# Patient Record
Sex: Female | Born: 1990 | Race: Black or African American | Hispanic: No | Marital: Single | Smoking: Never smoker
Health system: Southern US, Community
[De-identification: ages and names within clinical notes are randomized; demographics above are authoritative.]

---

## 2015-11-06 ENCOUNTER — Other Ambulatory Visit (HOSPITAL_COMMUNITY)
Admission: RE | Admit: 2015-11-06 | Discharge: 2015-11-06 | Disposition: A | Discharge status: Home / Self Care | Payer: Managed Care, Other (non HMO) | Source: Ambulatory Visit | Attending: Nurse Practitioner | Admitting: Nurse Practitioner

## 2015-11-06 DIAGNOSIS — Z113 Encounter for screening for infections with a predominantly sexual mode of transmission: Secondary | ICD-10-CM | POA: Diagnosis present

## 2015-11-06 DIAGNOSIS — Z01419 Encounter for gynecological examination (general) (routine) without abnormal findings: Secondary | ICD-10-CM | POA: Diagnosis not present

## 2016-03-10 ENCOUNTER — Encounter (HOSPITAL_COMMUNITY): Payer: Self-pay | Admitting: Emergency Medicine

## 2016-03-10 DIAGNOSIS — Y92830 Public park as the place of occurrence of the external cause: Secondary | ICD-10-CM | POA: Insufficient documentation

## 2016-03-10 DIAGNOSIS — Y9344 Activity, trampolining: Secondary | ICD-10-CM | POA: Insufficient documentation

## 2016-03-10 DIAGNOSIS — S93401A Sprain of unspecified ligament of right ankle, initial encounter: Secondary | ICD-10-CM | POA: Insufficient documentation

## 2016-03-10 DIAGNOSIS — Y999 Unspecified external cause status: Secondary | ICD-10-CM | POA: Diagnosis not present

## 2016-03-10 DIAGNOSIS — X509XXA Other and unspecified overexertion or strenuous movements or postures, initial encounter: Secondary | ICD-10-CM | POA: Insufficient documentation

## 2016-03-10 DIAGNOSIS — S99911A Unspecified injury of right ankle, initial encounter: Secondary | ICD-10-CM | POA: Diagnosis present

## 2016-03-10 NOTE — ED Notes (Signed)
Pt. reports right ankle pain with mild swelling onset last month injured while playing on a trampoline . Ambulatory / pain increases with palpation and weight bearing .

## 2016-03-11 ENCOUNTER — Emergency Department (HOSPITAL_COMMUNITY): Payer: Managed Care, Other (non HMO)

## 2016-03-11 ENCOUNTER — Emergency Department (HOSPITAL_COMMUNITY)
Admission: EM | Admit: 2016-03-11 | Discharge: 2016-03-11 | Disposition: A | Discharge status: Home / Self Care | Payer: Managed Care, Other (non HMO) | Attending: Emergency Medicine | Admitting: Emergency Medicine

## 2016-03-11 DIAGNOSIS — S93401A Sprain of unspecified ligament of right ankle, initial encounter: Secondary | ICD-10-CM

## 2016-03-11 NOTE — ED Provider Notes (Signed)
CSN: [id number]     Arrival date & time 03/10/16  2335 History  By signing my name below, I, Katherine Higgins, attest that this documentation has been prepared under the direction and in the presence of Katherine MondayErin Kenwood Rosiak, MD. Electronically Signed: Bethel BornBritney Higgins, ED Scribe. 03/11/2016. 3:13 AM   Chief Complaint  Patient presents with  . Ankle Pain   The history is provided by the patient. No language interpreter was used.   Katherine Higgins is a 25 y.o. female who presents to the Emergency Department complaining of ongoing, 8/10 in severity, lateral right ankle pain and swelling with onset 1 week ago after [place]ampoline park. The pain is exacerbated by walking. Pt denies other injury.   History reviewed. No pertinent past medical history. History reviewed. No pertinent past surgical history. No family history on file. Social History  Substance Use Topics  . Smoking status: Never Smoker   . Smokeless tobacco: None  . Alcohol Use: No   OB History    No data available     Review of Systems  Constitutional: Negative for fever.  HENT: Negative for sore throat.   Eyes: Negative for visual disturbance.  Respiratory: Negative for cough and shortness of breath.   Cardiovascular: Negative for chest pain.  Gastrointestinal: Negative for abdominal pain.  Genitourinary: Negative for difficulty urinating.  Musculoskeletal: Positive for arthralgias (right ankle pain and swelling). Negative for back pain and neck pain.  Skin: Negative for rash and wound.  Neurological: Negative for syncope and headaches.      Allergies  Review of patient's allergies indicates no known allergies.  Home Medications   Prior to Admission medications   Not on File   BP 112/57 mmHg  Pulse 74  Temp(Src) 98.5 F (36.9 C) (Oral)  Resp 16  Ht 5\' 6"  (1.676 m)  Wt 144 lb 5 oz (65.46 kg)  BMI 23.30 kg/m2  SpO2 100%  LMP 02/24/2016 (Approximate) Physical Exam  Constitutional: She is  oriented to person, place, and time. She appears well-developed and well-nourished. No distress.  HENT:  Head: Normocephalic and atraumatic.  Eyes: Conjunctivae and EOM are normal.  Neck: Normal range of motion.  Cardiovascular: Normal rate, regular rhythm and intact distal pulses.   Pulmonary/Chest: Effort normal. No respiratory distress.  Musculoskeletal: She exhibits no edema.       Right ankle: She exhibits swelling (mild). She exhibits normal range of motion and no deformity. Tenderness. AITFL tenderness found. No posterior TFL, no head of 5th metatarsal and no proximal fibula tenderness found.  Tenderness at the right lateral malleolus   Neurological: She is alert and oriented to person, place, and time.  Skin: Skin is warm and dry. No rash noted. She is not diaphoretic. No erythema.  Nursing note and vitals reviewed.   ED Course  Procedures (including critical care time) DIAGNOSTIC STUDIES: Oxygen Saturation is 100% on RA,  normal by my interpretation.    COORDINATION OF CARE: 3:12 AM Discussed treatment plan which includes right ankle XR, splinting, and crutches with pt at bedside and pt agreed to plan.  Labs Review Labs Reviewed - No data to display  Imaging Review Dg Ankle Complete Right  03/11/2016  CLINICAL DATA:  25 year old female with right ankle injury. EXAM: RIGHT ANKLE - COMPLETE 3+ VIEW COMPARISON:  None. FINDINGS: There is no evidence of fracture, dislocation, or joint effusion. There is no evidence of arthropathy or other focal bone abnormality. Soft tissues are unremarkable. IMPRESSION: Negative. Electronically Signed  By: Katherine CollardArash  Higgins M.D.   On: 03/11/2016 00:37   I have personally reviewed and evaluated these images as part of my medical decision-making.   EKG Interpretation None      MDM   Final diagnoses:  Sprained ankle, right, initial encounter   24yo female with right ankle pain after landing wrong at trampoline park. XR without fx.  NV  intact. Likely ankle sprain. Recommend ice, NSAIDs, aircast, weight bearing as tolerated. Patient discharged in stable condition with understanding of reasons to return.   I personally performed the services described in this documentation, which was scribed in my presence. The recorded information has been reviewed and is accurate.    Katherine MondayErin Katherine Savidge, MD 03/12/16 [phone]

## 2016-03-11 NOTE — Discharge Instructions (Signed)
Ankle Sprain  An ankle sprain is an injury to the strong, fibrous tissues (ligaments) that hold the bones of your ankle joint together.   CAUSES  An ankle sprain is usually caused by a fall or by twisting your ankle. Ankle sprains most commonly occur when you step on the outer edge of your foot, and your ankle turns inward. People who participate in sports are more prone to these types of injuries.   SYMPTOMS   · Pain in your ankle. The pain may be present at rest or only when you are trying to stand or walk.  · Swelling.  · Bruising. Bruising may develop immediately or within 1 to 2 days after your injury.  · Difficulty standing or walking, particularly when turning corners or changing directions.  DIAGNOSIS   Your caregiver will ask you details about your injury and perform a physical exam of your ankle to determine if you have an ankle sprain. During the physical exam, your caregiver will press on and apply pressure to specific areas of your foot and ankle. Your caregiver will try to move your ankle in certain ways. An X-ray exam may be done to be sure a bone was not broken or a ligament did not separate from one of the bones in your ankle (avulsion fracture).   TREATMENT   Certain types of braces can help stabilize your ankle. Your caregiver can make a recommendation for this. Your caregiver may recommend the use of medicine for pain. If your sprain is severe, your caregiver may refer you to a surgeon who helps to restore function to parts of your skeletal system (orthopedist) or a physical therapist.  HOME CARE INSTRUCTIONS   · Apply ice to your injury for 1-2 days or as directed by your caregiver. Applying ice helps to reduce inflammation and pain.    Put ice in a plastic bag.    Place a towel between your skin and the bag.    Leave the ice on for 15-20 minutes at a time, every 2 hours while you are awake.  · Only take over-the-counter or prescription medicines for pain, discomfort, or fever as directed by  your caregiver.  · Elevate your injured ankle above the level of your heart as much as possible for 2-3 days.  · If your caregiver recommends crutches, use them as instructed. Gradually put weight on the affected ankle. Continue to use crutches or a cane until you can walk without feeling pain in your ankle.  · If you have a plaster splint, wear the splint as directed by your caregiver. Do not rest it on anything harder than a pillow for the first 24 hours. Do not put weight on it. Do not get it wet. You may take it off to take a shower or bath.  · You may have been given an elastic bandage to wear around your ankle to provide support. If the elastic bandage is too tight (you have numbness or tingling in your foot or your foot becomes cold and blue), adjust the bandage to make it comfortable.  · If you have an air splint, you may blow more air into it or let air out to make it more comfortable. You may take your splint off at night and before taking a shower or bath. Wiggle your toes in the splint several times per day to decrease swelling.  SEEK MEDICAL CARE IF:   · You have rapidly increasing bruising or swelling.  · Your toes feel   extremely cold or you lose feeling in your foot.  · Your pain is not relieved with medicine.  SEEK IMMEDIATE MEDICAL CARE IF:  · Your toes are numb or blue.  · You have severe pain that is increasing.  MAKE SURE YOU:   · Understand these instructions.  · Will watch your condition.  · Will get help right away if you are not doing well or get worse.     This information is not intended to replace advice given to you by your health care provider. Make sure you discuss any questions you have with your health care provider.     Document Released: 09/06/2005 Document Revised: 09/27/2014 Document Reviewed: 09/18/2011  Elsevier Interactive Patient Education ©2016 [employer]      Acute Ankle Sprain With Phase I Rehab  An acute ankle sprain is a partial or complete tear in one or more of the  ligaments of the ankle due to traumatic injury. The severity of the injury depends on both the number of ligaments sprained and the grade of sprain. There are 3 grades of sprains.   · A grade 1 sprain is a mild sprain. There is a slight pull without obvious tearing. There is no loss of strength, and the muscle and ligament are the correct length.  · A grade 2 sprain is a moderate sprain. There is tearing of fibers within the substance of the ligament where it connects two bones or two cartilages. The length of the ligament is increased, and there is usually decreased strength.  · A grade 3 sprain is a complete rupture of the ligament and is uncommon.  In addition to the grade of sprain, there are three types of ankle sprains.   Lateral ankle sprains: This is a sprain of one or more of the three ligaments on the outer side (lateral) of the ankle. These are the most common sprains.  Medial ankle sprains: There is one large triangular ligament of the inner side (medial) of the ankle that is susceptible to injury. Medial ankle sprains are less common.  Syndesmosis, "high ankle," sprains: The syndesmosis is the ligament that connects the two bones of the lower leg. Syndesmosis sprains usually only occur with very severe ankle sprains.  SYMPTOMS  · Pain, tenderness, and swelling in the ankle, starting at the side of injury that may progress to the whole ankle and foot with time.  · "Pop" or tearing sensation at the time of injury.  · Bruising that may spread to the heel.  · Impaired ability to walk soon after injury.  CAUSES   · Acute ankle sprains are caused by trauma placed on the ankle that temporarily forces or pries the anklebone (talus) out of its normal socket.  · Stretching or tearing of the ligaments that normally hold the joint in place (usually due to a twisting injury).  RISK INCREASES WITH:  · Previous ankle sprain.  · Sports in which the foot may land awkwardly (i.e., basketball, volleyball, or soccer) or  walking or running on uneven or rough surfaces.  · Shoes with inadequate support to prevent sideways motion when stress occurs.  · Poor strength and flexibility.  · Poor balance skills.  · Contact sports.  PREVENTION   · Warm up and stretch properly before activity.  · Maintain physical fitness:    Ankle and leg flexibility, muscle strength, and endurance.    Cardiovascular fitness.  · Balance training activities.  · Use proper technique and have a   coach correct improper technique.  · Taping, protective strapping, bracing, or high-top tennis shoes may help prevent injury. Initially, tape is best; however, it loses most of its support function within 10 to 15 minutes.  · Wear proper-fitted protective shoes (High-top shoes with taping or bracing is more effective than either alone).  · Provide the ankle with support during sports and practice activities for 12 months following injury.  PROGNOSIS   · If treated properly, ankle sprains can be expected to recover completely; however, the length of recovery depends on the degree of injury.  · A grade 1 sprain usually heals enough in 5 to 7 days to allow modified activity and requires an average of 6 weeks to heal completely.  · A grade 2 sprain requires 6 to 10 weeks to heal completely.  · A grade 3 sprain requires 12 to 16 weeks to heal.  · A syndesmosis sprain often takes more than 3 months to heal.  RELATED COMPLICATIONS   · Frequent recurrence of symptoms may result in a chronic problem. Appropriately addressing the problem the first time decreases the frequency of recurrence and optimizes healing time. Severity of the initial sprain does not predict the likelihood of later instability.  · Injury to other structures (bone, cartilage, or tendon).  · A chronically unstable or arthritic ankle joint is a possibility with repeated sprains.  TREATMENT  Treatment initially involves the use of ice, medication, and compression bandages to help reduce pain and inflammation.  Ankle sprains are usually immobilized in a walking cast or boot to allow for healing. Crutches may be recommended to reduce pressure on the injury. After immobilization, strengthening and stretching exercises may be necessary to regain strength and a full range of motion. Surgery is rarely needed to treat ankle sprains.  MEDICATION   · Nonsteroidal anti-inflammatory medications, such as aspirin and ibuprofen (do not take for the first 3 days after injury or within 7 days before surgery), or other minor pain relievers, such as acetaminophen, are often recommended. Take these as directed by your caregiver. Contact your caregiver immediately if any bleeding, stomach upset, or signs of an allergic reaction occur from these medications.  · Ointments applied to the skin may be helpful.  · Pain relievers may be prescribed as necessary by your caregiver. Do not take prescription pain medication for longer than 4 to 7 days. Use only as directed and only as much as you need.  HEAT AND COLD  · Cold treatment (icing) is used to relieve pain and reduce inflammation for acute and chronic cases. Cold should be applied for 10 to 15 minutes every 2 to 3 hours for inflammation and pain and immediately after any activity that aggravates your symptoms. Use ice packs or an ice massage.  · Heat treatment may be used before performing stretching and strengthening activities prescribed by your caregiver. Use a heat pack or a warm soak.  SEEK IMMEDIATE MEDICAL CARE IF:   · Pain, swelling, or bruising worsens despite treatment.  · You experience pain, numbness, discoloration, or coldness in the foot or toes.  · New, unexplained symptoms develop (drugs used in treatment may produce side effects.)  EXERCISES   PHASE I EXERCISES  RANGE OF MOTION (ROM) AND STRETCHING EXERCISES - Ankle Sprain, Acute Phase I, Weeks 1 to 2  These exercises may help you when beginning to restore flexibility in your ankle. You will likely work on these exercises for  the 1 to 2 weeks after your injury.   Once your physician, physical therapist, or athletic trainer sees adequate progress, he or she will advance your exercises. While completing these exercises, remember:   · Restoring tissue flexibility helps normal motion to return to the joints. This allows healthier, less painful movement and activity.  · An effective stretch should be held for at least 30 seconds.  · A stretch should never be painful. You should only feel a gentle lengthening or release in the stretched tissue.  RANGE OF MOTION - Dorsi/Plantar Flexion  · While sitting with your right / left knee straight, draw the top of your foot upwards by flexing your ankle. Then reverse the motion, pointing your toes downward.  · Hold each position for __________ seconds.  · After completing your first set of exercises, repeat this exercise with your knee bent.  Repeat __________ times. Complete this exercise __________ times per day.   RANGE OF MOTION - Ankle Alphabet  · Imagine your right / left big toe is a pen.  · Keeping your hip and knee still, write out the entire alphabet with your "pen." Make the letters as large as you can without increasing any discomfort.  Repeat __________ times. Complete this exercise __________ times per day.   STRENGTHENING EXERCISES - Ankle Sprain, Acute -Phase I, Weeks 1 to 2  These exercises may help you when beginning to restore strength in your ankle. You will likely work on these exercises for 1 to 2 weeks after your injury. Once your physician, physical therapist, or athletic trainer sees adequate progress, he or she will advance your exercises. While completing these exercises, remember:   · Muscles can gain both the endurance and the strength needed for everyday activities through controlled exercises.  · Complete these exercises as instructed by your physician, physical therapist, or athletic trainer. Progress the resistance and repetitions only as guided.  · You may experience  muscle soreness or fatigue, but the pain or discomfort you are trying to eliminate should never worsen during these exercises. If this pain does worsen, stop and make certain you are following the directions exactly. If the pain is still present after adjustments, discontinue the exercise until you can discuss the trouble with your clinician.  STRENGTH - Dorsiflexors  · Secure a rubber exercise band/tubing to a fixed object (i.e., table, pole) and loop the other end around your right / left foot.  · Sit on the floor facing the fixed object. The band/tubing should be slightly tense when your foot is relaxed.  · Slowly draw your foot back toward you using your ankle and toes.  · Hold this position for __________ seconds. Slowly release the tension in the band and return your foot to the starting position.  Repeat __________ times. Complete this exercise __________ times per day.   STRENGTH - Plantar-flexors   · Sit with your right / left leg extended. Holding onto both ends of a rubber exercise band/tubing, loop it around the ball of your foot. Keep a slight tension in the band.  · Slowly push your toes away from you, pointing them downward.  · Hold this position for __________ seconds. Return slowly, controlling the tension in the band/tubing.  Repeat __________ times. Complete this exercise __________ times per day.   STRENGTH - Ankle Eversion  · Secure one end of a rubber exercise band/tubing to a fixed object (table, pole). Loop the other end around your foot just before your toes.  · Place your fists between your knees. This will focus   your strengthening at your ankle.  · Drawing the band/tubing across your opposite foot, slowly, pull your little toe out and up. Make sure the band/tubing is positioned to resist the entire motion.  · Hold this position for __________ seconds.  Have your muscles resist the band/tubing as it slowly pulls your foot back to the starting position.   Repeat __________ times. Complete  this exercise __________ times per day.   STRENGTH - Ankle Inversion  · Secure one end of a rubber exercise band/tubing to a fixed object (table, pole). Loop the other end around your foot just before your toes.  · Place your fists between your knees. This will focus your strengthening at your ankle.  · Slowly, pull your big toe up and in, making sure the band/tubing is positioned to resist the entire motion.  · Hold this position for __________ seconds.  · Have your muscles resist the band/tubing as it slowly pulls your foot back to the starting position.  Repeat __________ times. Complete this exercises __________ times per day.   STRENGTH - Towel Curls  · Sit in a chair positioned on a non-carpeted surface.  · Place your right / left foot on a towel, keeping your heel on the floor.  · Pull the towel toward your heel by only curling your toes. Keep your heel on the floor.  · If instructed by your physician, physical therapist, or athletic trainer, add weight to the end of the towel.  Repeat __________ times. Complete this exercise __________ times per day.     This information is not intended to replace advice given to you by your health care provider. Make sure you discuss any questions you have with your health care provider.     Document Released: 04/07/2005 Document Revised: 09/27/2014 Document Reviewed: 12/19/2008  [employer]vier Interactive Patient Education ©2016 [employer]

## 2017-03-06 IMAGING — CR DG ANKLE COMPLETE 3+V*R*
3 series · 3 of 3 positions shown · non-contrast
Comparison: None.

CLINICAL DATA: 24-year-old female with right ankle injury.

EXAM:
RIGHT ANKLE - COMPLETE 3+ VIEW

[ankle ap]
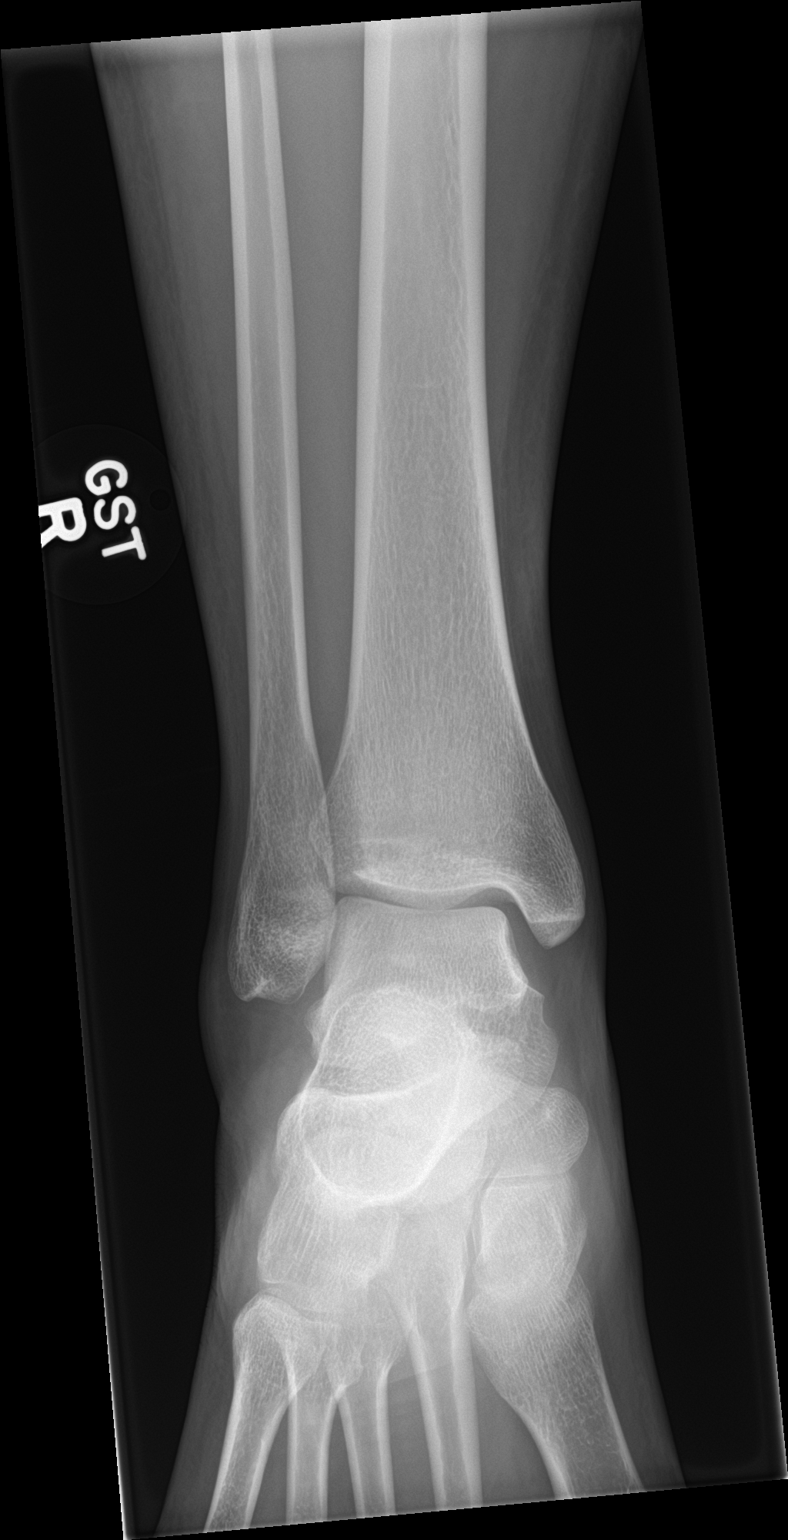

[ankle obl]
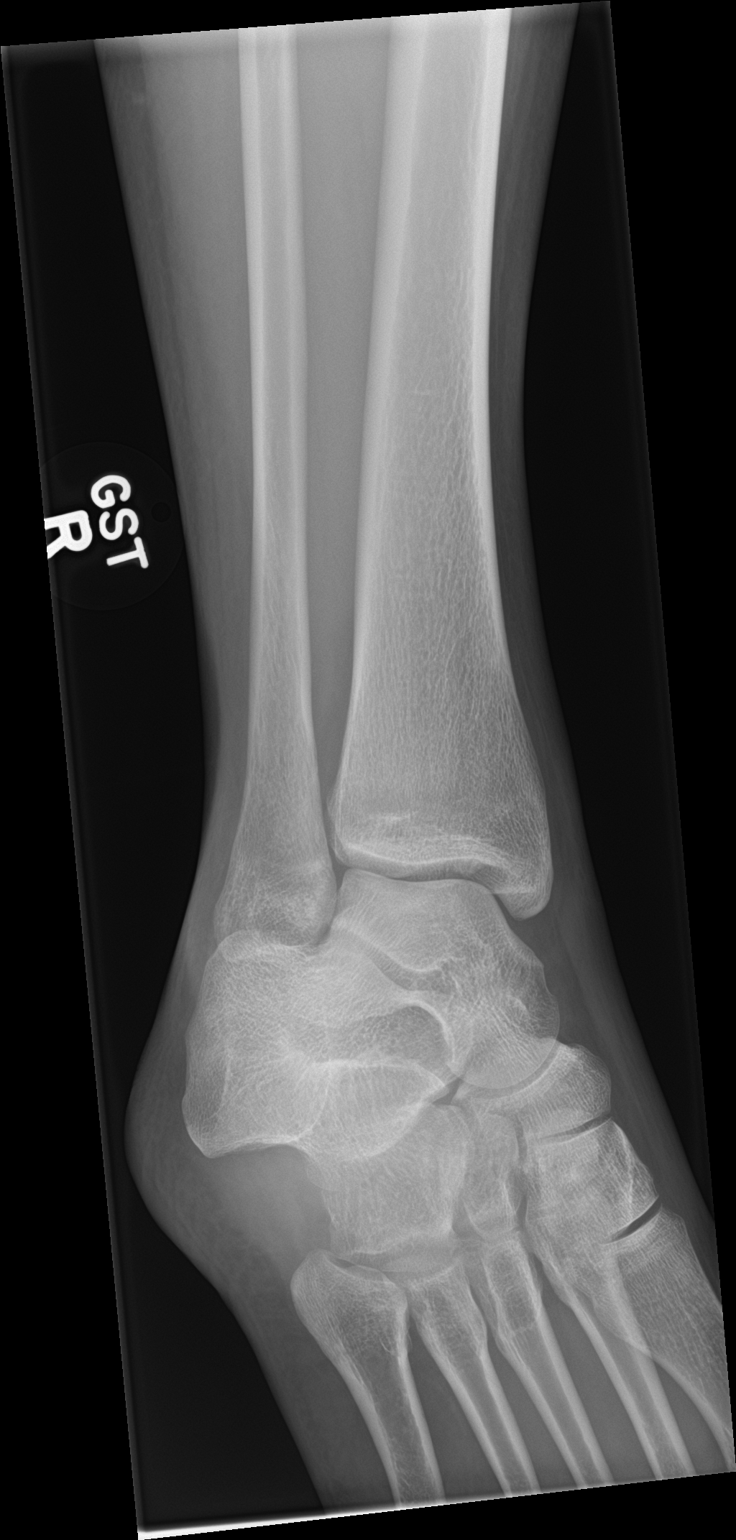

[ankle lat]
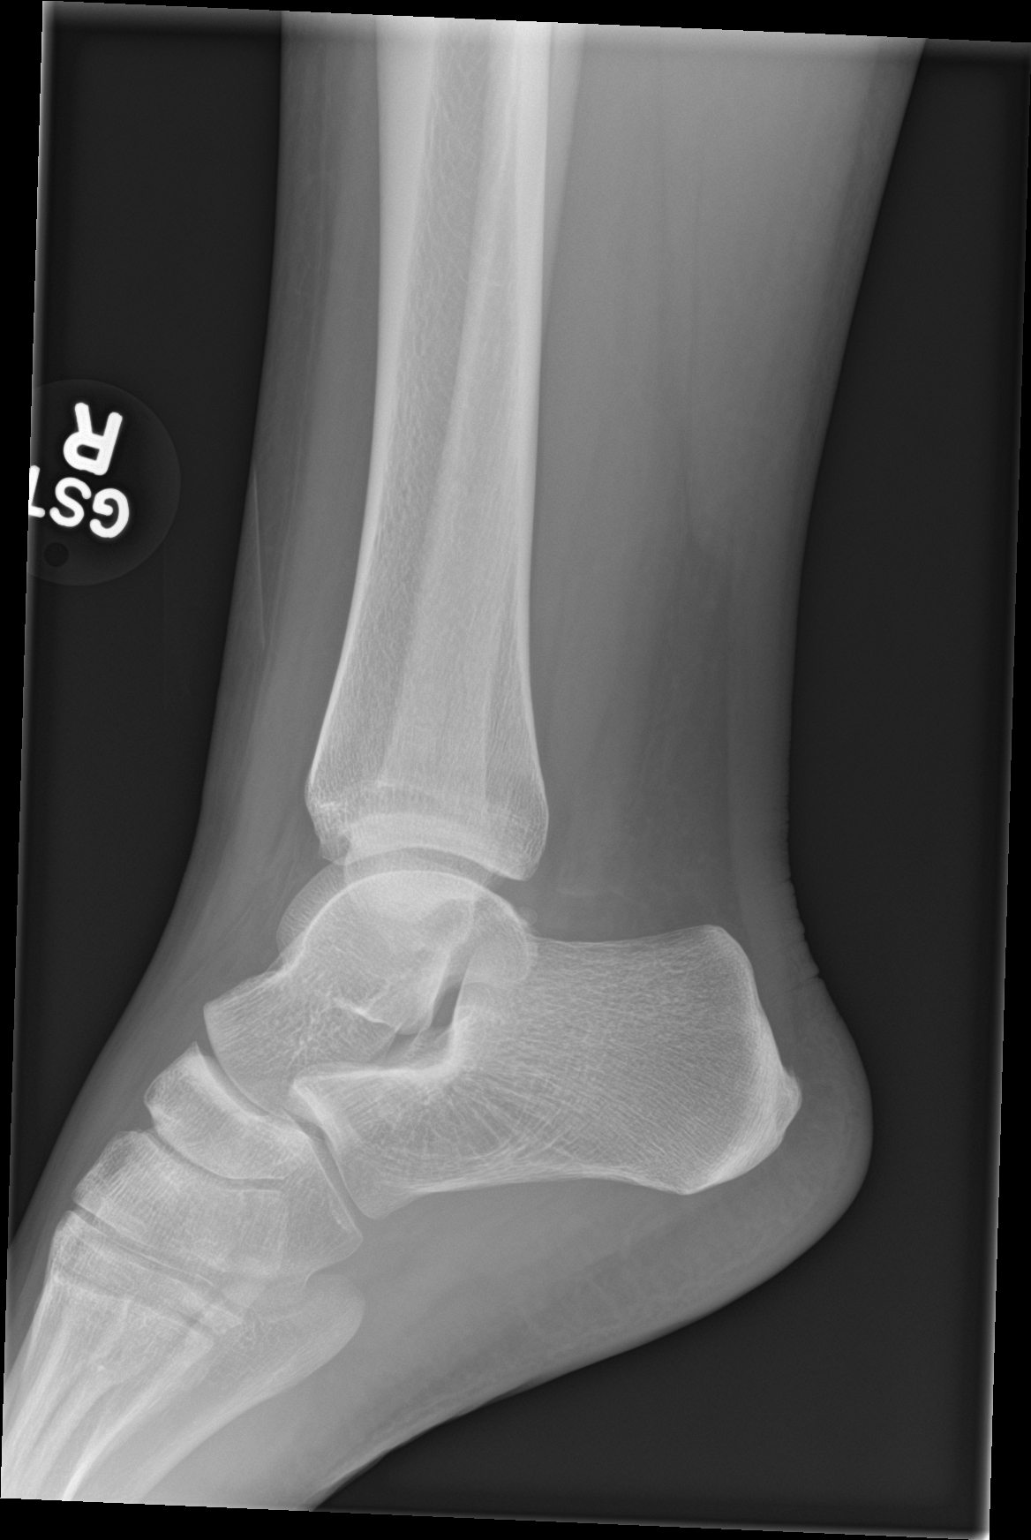

[3 of 3 positions shown; findings below may reference images not displayed]

FINDINGS: There is no evidence of fracture, dislocation, or joint effusion.
There is no evidence of arthropathy or other focal bone abnormality.
Soft tissues are unremarkable.
IMPRESSION: Negative.
